# Patient Record
Sex: Male | Born: 1980 | Race: Black or African American | Hispanic: No | Marital: Single | Smoking: Current every day smoker
Health system: Southern US, Community
[De-identification: ages and names within clinical notes are randomized; demographics above are authoritative.]

## PROBLEM LIST (undated history)

## (undated) DIAGNOSIS — F840 Autistic disorder: Secondary | ICD-10-CM

---

## 2004-04-10 ENCOUNTER — Emergency Department (HOSPITAL_COMMUNITY): Admission: EM | Admit: 2004-04-10 | Discharge: 2004-04-10 | Payer: Self-pay | Admitting: Emergency Medicine

## 2013-05-01 ENCOUNTER — Emergency Department (HOSPITAL_COMMUNITY)
Admission: EM | Admit: 2013-05-01 | Discharge: 2013-05-01 | Disposition: A | Discharge status: Home / Self Care | Payer: Medicaid Other | Attending: Emergency Medicine | Admitting: Emergency Medicine

## 2013-05-01 ENCOUNTER — Encounter (HOSPITAL_COMMUNITY): Payer: Self-pay

## 2013-05-01 ENCOUNTER — Emergency Department (HOSPITAL_COMMUNITY): Payer: Medicaid Other

## 2013-05-01 DIAGNOSIS — F17200 Nicotine dependence, unspecified, uncomplicated: Secondary | ICD-10-CM | POA: Insufficient documentation

## 2013-05-01 DIAGNOSIS — R1033 Periumbilical pain: Secondary | ICD-10-CM | POA: Insufficient documentation

## 2013-05-01 DIAGNOSIS — R109 Unspecified abdominal pain: Secondary | ICD-10-CM

## 2013-05-01 DIAGNOSIS — K5900 Constipation, unspecified: Secondary | ICD-10-CM

## 2013-05-01 HISTORY — DX: Autistic disorder: F84.0

## 2013-05-01 LAB — CBC WITH DIFFERENTIAL/PLATELET
Basophils Relative: 1 % (ref 0–1)
Lymphocytes Relative: 16 % (ref 12–46)
MCH: 27.5 pg (ref 26.0–34.0)
Monocytes Absolute: 0.8 10*3/uL (ref 0.1–1.0)
Neutro Abs: 7.8 10*3/uL — ABNORMAL HIGH (ref 1.7–7.7)
RBC: 5.01 MIL/uL (ref 4.22–5.81)
RDW: 12.9 % (ref 11.5–15.5)
WBC: 10.4 10*3/uL (ref 4.0–10.5)

## 2013-05-01 LAB — COMPREHENSIVE METABOLIC PANEL
ALT: 30 U/L (ref 0–53)
AST: 30 U/L (ref 0–37)
BUN: 14 mg/dL (ref 6–23)
Calcium: 9.1 mg/dL (ref 8.4–10.5)
Creatinine, Ser: 0.85 mg/dL (ref 0.50–1.35)
Potassium: 4.3 mEq/L (ref 3.5–5.1)
Sodium: 141 mEq/L (ref 135–145)

## 2013-05-01 LAB — URINALYSIS, ROUTINE W REFLEX MICROSCOPIC
Bilirubin Urine: NEGATIVE
Glucose, UA: NEGATIVE mg/dL
Hgb urine dipstick: NEGATIVE
Leukocytes, UA: NEGATIVE
Specific Gravity, Urine: 1.025 (ref 1.005–1.030)
Urobilinogen, UA: 0.2 mg/dL (ref 0.0–1.0)
pH: 6 (ref 5.0–8.0)

## 2013-05-01 LAB — LIPASE, BLOOD: Lipase: 36 U/L (ref 11–59)

## 2013-05-01 MED ORDER — IOHEXOL 300 MG/ML  SOLN
50.0000 mL | Freq: Once | INTRAMUSCULAR | Status: AC | PRN
  Administered 2013-05-01: 50 mL via ORAL

## 2013-05-01 MED ORDER — DISPOSABLE ENEMA 19-7 GM/118ML RE ENEM: 1.0000 | ENEMA | Freq: Once | RECTAL | Status: AC

## 2013-05-01 MED ORDER — POLYETHYLENE GLYCOL 3350 17 G PO PACK: 17.0000 g | PACK | Freq: Every day | ORAL | Status: AC

## 2013-05-01 MED ORDER — IOHEXOL 300 MG/ML  SOLN
100.0000 mL | Freq: Once | INTRAMUSCULAR | Status: AC | PRN
  Administered 2013-05-01: 100 mL via INTRAVENOUS

## 2013-05-01 NOTE — ED Notes (Signed)
Caregiver reports that she noticed that the patient was laying around more than usual, decreased appetite.  The patient has a history of autism and has difficulty with conversation.  The patient states that he feels constipated and has not had a BM for several days.

## 2013-05-01 NOTE — ED Provider Notes (Signed)
History     CSN: [id number]  Arrival date & time 05/01/13  1529   First MD Initiated Contact with Patient 05/01/13 1605      Chief Complaint  Patient presents with  . Abdominal Pain    (Consider location/radiation/quality/duration/timing/severity/associated sxs/prior treatment) HPI Comments: Autistic male presenting with two-day history of lower abdominal pain is periumbilical. It does not move. Denies any fever, nausea or vomiting. Does have decreased appetite. Last bowel movement 2 days ago. Denies any fever, chills, chest pain or shortness of breath. No testicle pain, difficulty with urination.  The history is provided by the patient.    Past Medical History  Diagnosis Date  . Autism     History reviewed. No pertinent past surgical history.  No family history on file.  History  Substance Use Topics  . Smoking status: Current Every Day Smoker    Types: Cigarettes  . Smokeless tobacco: Not on file  . Alcohol Use: No      Review of Systems  Constitutional: Negative for fever, activity change and appetite change.  HENT: Negative for congestion and rhinorrhea.   Respiratory: Negative for cough, chest tightness and stridor.   Cardiovascular: Negative for chest pain.  Gastrointestinal: Positive for abdominal pain and constipation. Negative for nausea, vomiting and diarrhea.  Genitourinary: Negative for dysuria, urgency, hematuria and testicular pain.  Musculoskeletal: Negative for back pain.  Skin: Negative for rash.  Neurological: Negative for dizziness, weakness and headaches.  A complete 10 system review of systems was obtained and all systems are negative except as noted in the HPI and PMH.    Allergies  Review of patient's allergies indicates no known allergies.  Home Medications  No current outpatient prescriptions on file.  BP 90/63  Pulse 84  Temp(Src) 98.7 F (37.1 C) (Oral)  Resp 20  Ht 5\' 10"  (1.778 m)  Wt 170 lb (77.111 kg)  BMI 24.39 kg/m2   SpO2 100%  Physical Exam  Constitutional: He is oriented to person, place, and time. He appears well-developed and well-nourished. No distress.  HENT:  Head: Normocephalic and atraumatic.  Mouth/Throat: Oropharynx is clear and moist. No oropharyngeal exudate.  Eyes: Conjunctivae and EOM are normal. Pupils are equal, round, and reactive to light.  Neck: Normal range of motion. Neck supple.  Cardiovascular: Normal rate, regular rhythm and normal heart sounds.   No murmur heard. Pulmonary/Chest: Effort normal and breath sounds normal. No respiratory distress.  Abdominal: Soft. There is tenderness. There is no rebound and no guarding.  Mild periumbilical pain without guarding or rebound. No pain at McBurney's point. No Murphy's sign.  Musculoskeletal: Normal range of motion. He exhibits no edema and no tenderness.  Neurological: He is alert and oriented to person, place, and time. No cranial nerve deficit. He exhibits normal muscle tone. Coordination normal.  Skin: Skin is warm.    ED Course  Procedures (including critical care time)  Labs Reviewed  CBC WITH DIFFERENTIAL - Abnormal; Notable for the following:    Neutro Abs 7.8 (*)    All other components within normal limits  COMPREHENSIVE METABOLIC PANEL - Abnormal; Notable for the following:    Total Bilirubin 0.2 (*)    All other components within normal limits  LIPASE, BLOOD  URINALYSIS, ROUTINE W REFLEX MICROSCOPIC   Ct Abdomen Pelvis W Contrast  05/01/2013  *RADIOLOGY REPORT*  Clinical Data: Decreased appetite.  Constipation.  CT ABDOMEN AND PELVIS WITH CONTRAST  Technique:  Multidetector CT imaging of the abdomen and pelvis was  performed following the standard protocol during bolus administration of intravenous contrast.  Contrast: 50mL OMNIPAQUE IOHEXOL 300 MG/ML  SOLN, OMNIPAQUE IOHEXOL 300 MG/ML  SOLN  Comparison: None.  Findings: Linear atelectasis or scarring in the lung bases.  Heart is borderline in size.  No  effusions.  Gallbladder is contracted.  Liver, spleen, stomach, adrenals, pancreas, kidneys are unremarkable.  There is a left inguinal hernia containing fat and fluid.  Urinary bladder wall appears mildly thickened.  Cannot exclude cystitis.  Small bowel is decompressed.  Moderate to large stool burden throughout the colon, five particularly rectosigmoid colon and cecum.  No free fluid, free air or adenopathy.  Aorta is normal caliber.  No acute bony abnormality.  IMPRESSION: Mild diffuse bladder wall thickening suggesting the possibility of cystitis.  Recommend clinical correlation.  Left inguinal hernia containing fat and a small amount of fluid.  Moderate to large stool burden throughout the colon.   Original Report Authenticated By: Charlett Nose, M.D.      No diagnosis found.    MDM  2 days of abdominal pain without associated symptoms. No fevers.  Labs unremarkable. CT scan obtained given patient's difficulty to communicate.  Urinalysis negative. CT discussed with Dr. Kearney Hard. Large stool burden. Appendix not identified but no right lower quadrant inflammation.  We'll treat for constipation with MiraLAX. Follow up with PCP. Tolerating by mouth in ED. No vomiting.      Glynn Octave, MD 05/01/13 [phone]

## 2013-05-01 NOTE — ED Notes (Addendum)
Mother reports that pt began having "navel pain " this afternoon.  Denies any fever, no nausea or vomiting. Pt stated that it hurts when he urinates.  Also having painful bm's

## 2014-07-14 IMAGING — CT CT ABD-PELV W/ CM
2 of 3 series · 16 of 46 positions shown, 18 images · IV contrast (Omnipaque 300)
Comparison: None.

CLINICAL DATA: Decreased appetite.  Constipation.

CT ABDOMEN AND PELVIS WITH CONTRAST
TECHNIQUE: Multidetector CT imaging of the abdomen and pelvis was
performed following the standard protocol during bolus
administration of intravenous contrast.
Contrast: 50mL OMNIPAQUE IOHEXOL 300 MG/ML  SOLN, 100mL OMNIPAQUE
IOHEXOL 300 MG/ML  SOLN

[Series 2: abd_pel_with 5.0 b40f · axial · 0.62mm/px · z∈[-446,-66]mm · 13 of 88 slices shown, 15 images]
[im 6/88  soft-tissue]
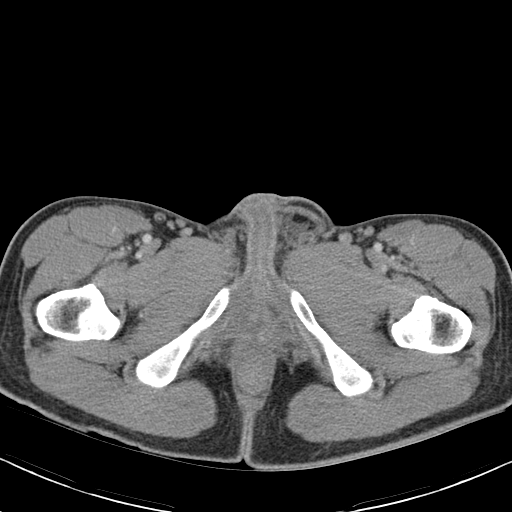
[im 6/88  bone]
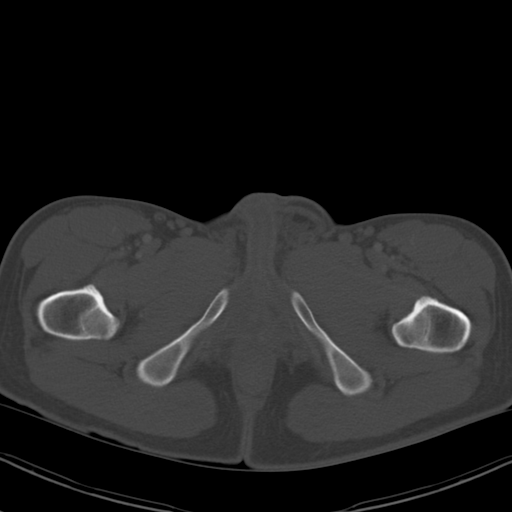
[im 12/88  soft-tissue]
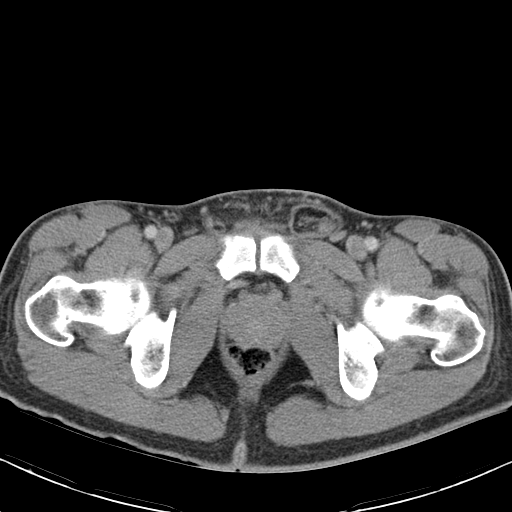
[im 17/88  soft-tissue]
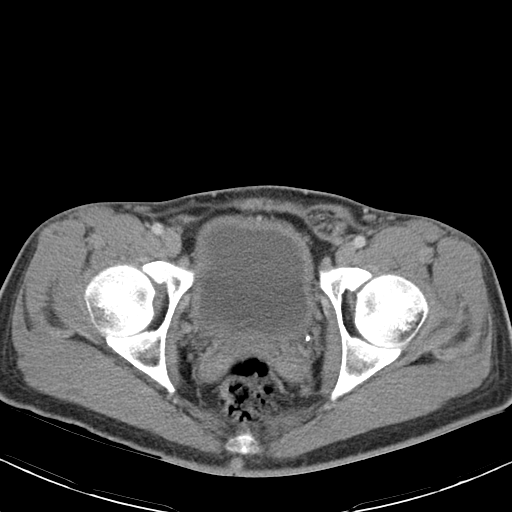
[im 26/88  soft-tissue]
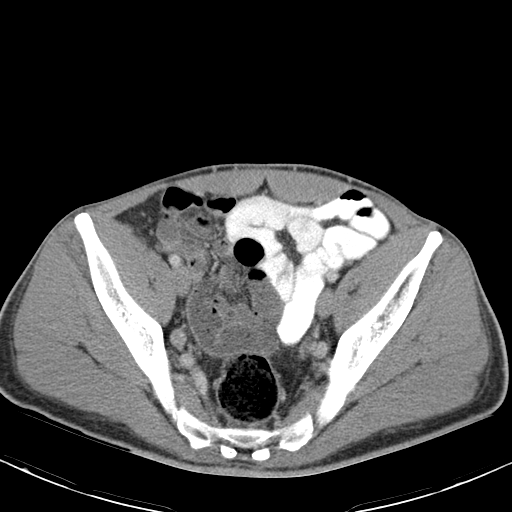
[im 31/88  soft-tissue]
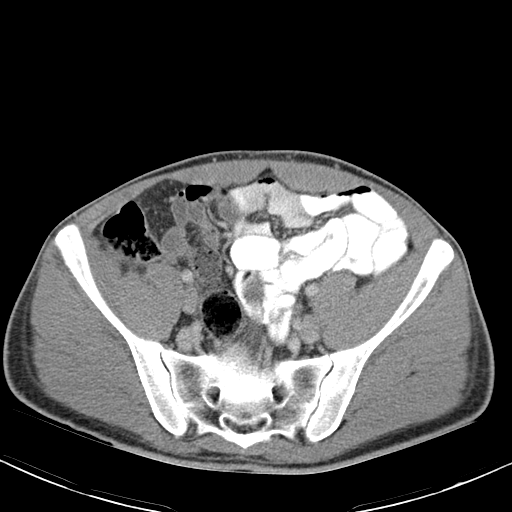
[im 37/88  soft-tissue]
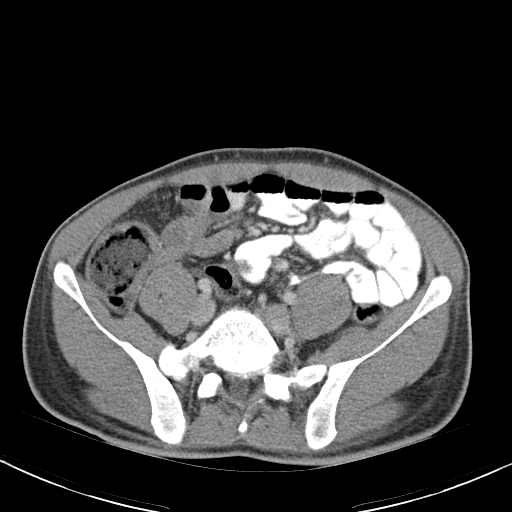
[im 45/88  soft-tissue]
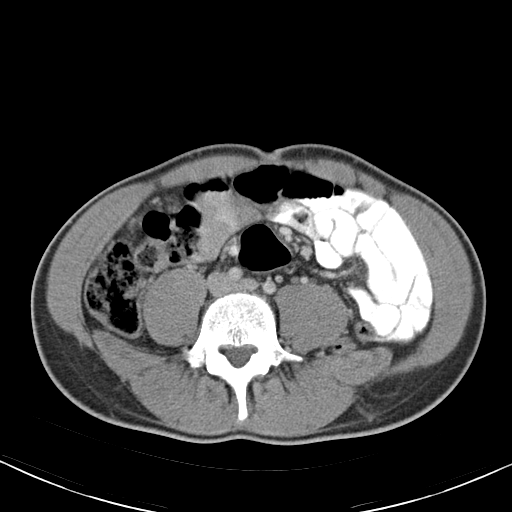
[im 51/88  soft-tissue]
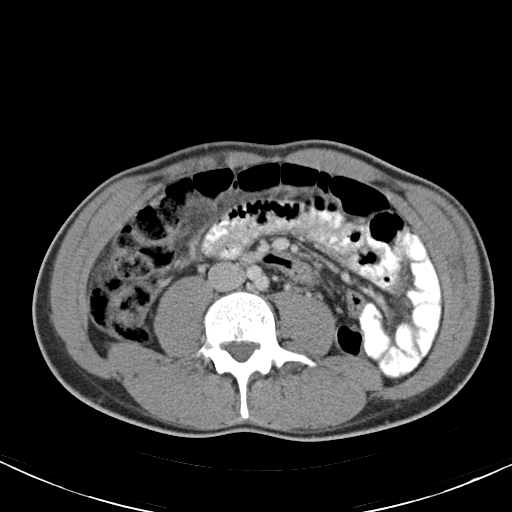
[im 57/88  soft-tissue]
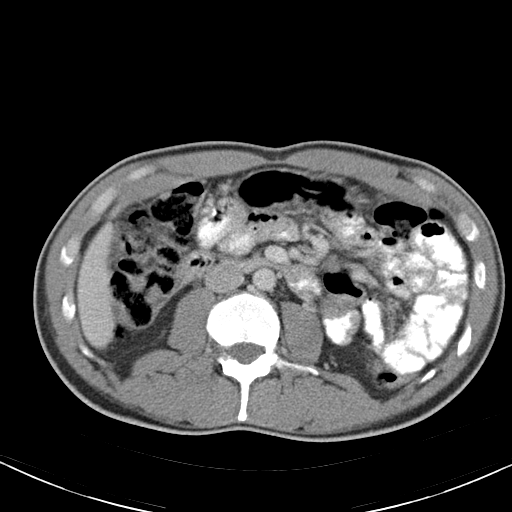
[im 57/88  bone]
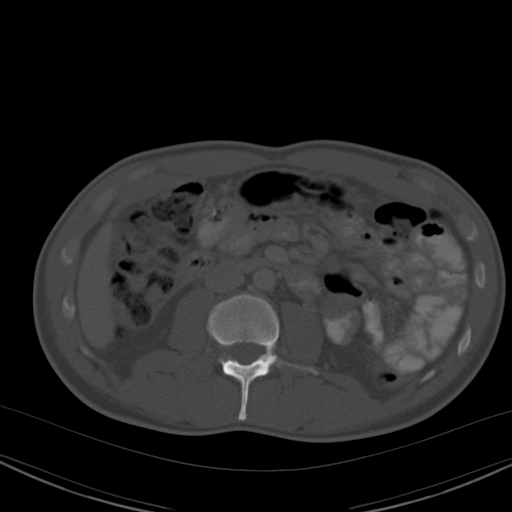
[im 62/88  soft-tissue]
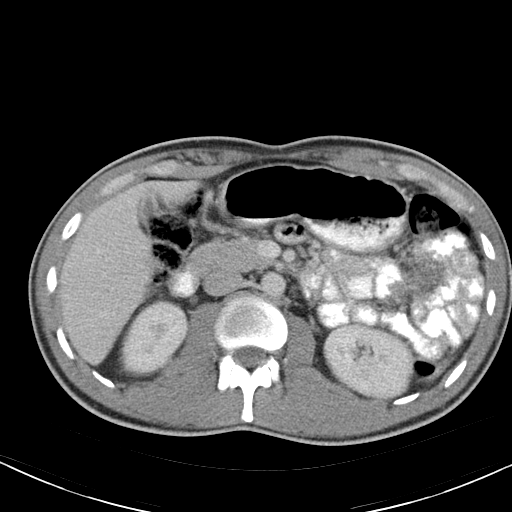
[im 71/88  soft-tissue]
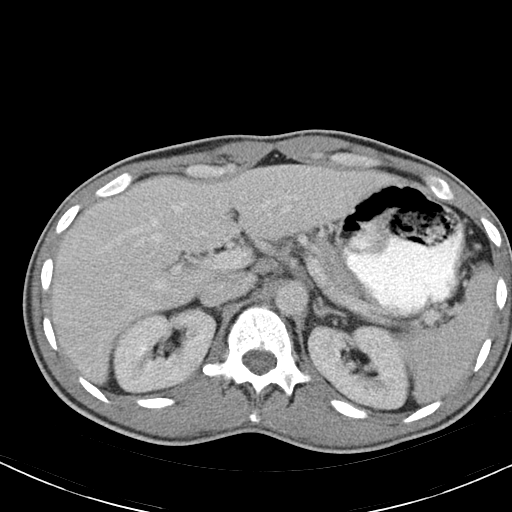
[im 76/88  soft-tissue]
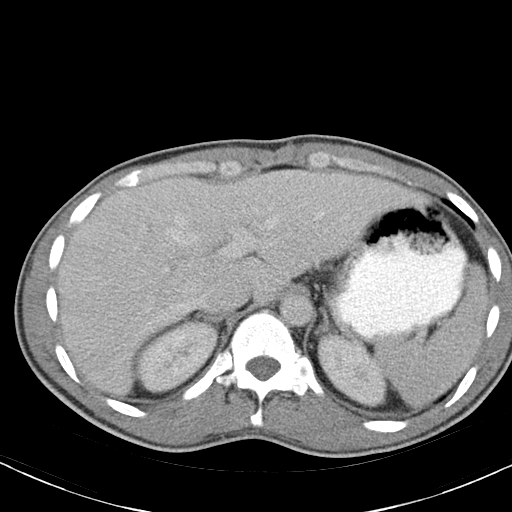
[im 82/88  soft-tissue]
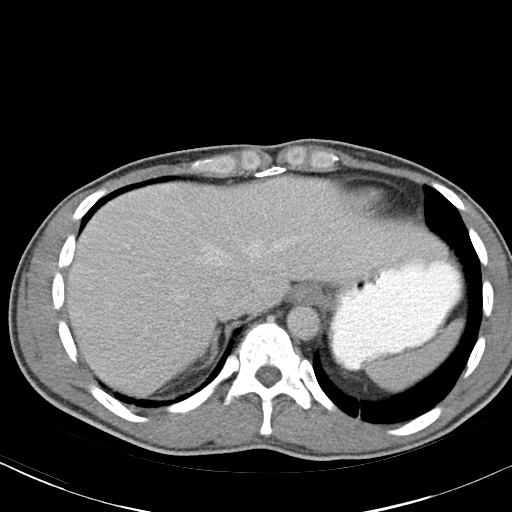

[Series 4: abd_pel_with 3.0 spo · coronal · 0.58mm/px · 3 of 71 slices shown]
[im 24/71  soft-tissue]
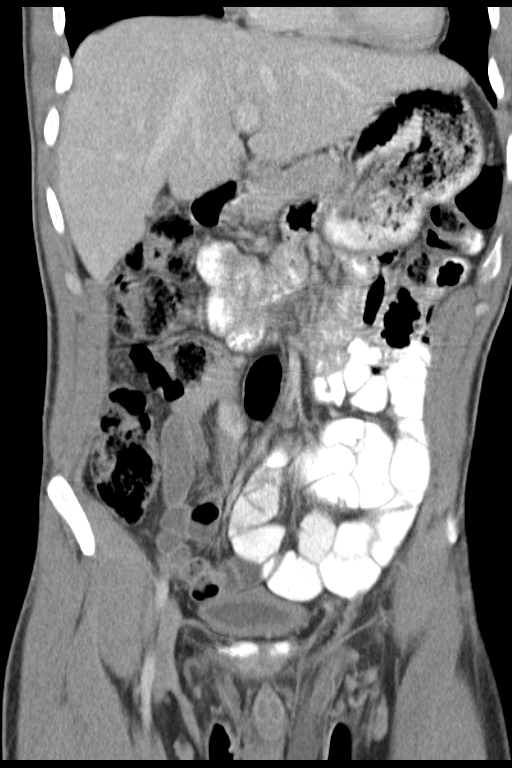
[im 32/71  soft-tissue]
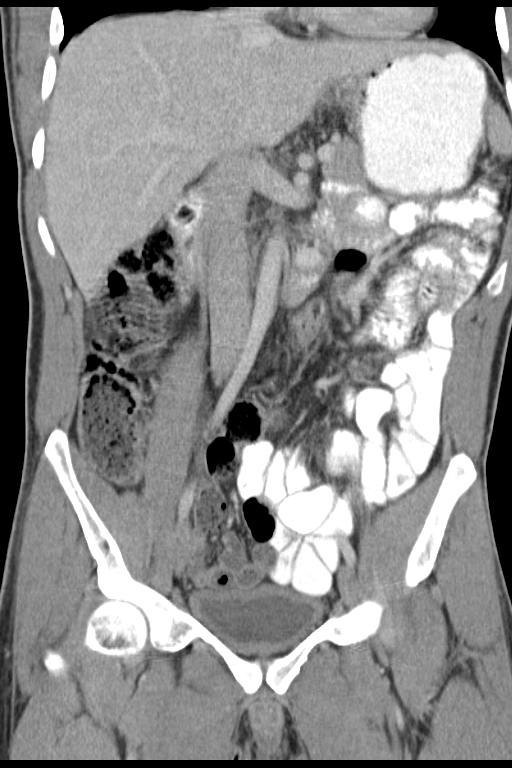
[im 39/71  soft-tissue]
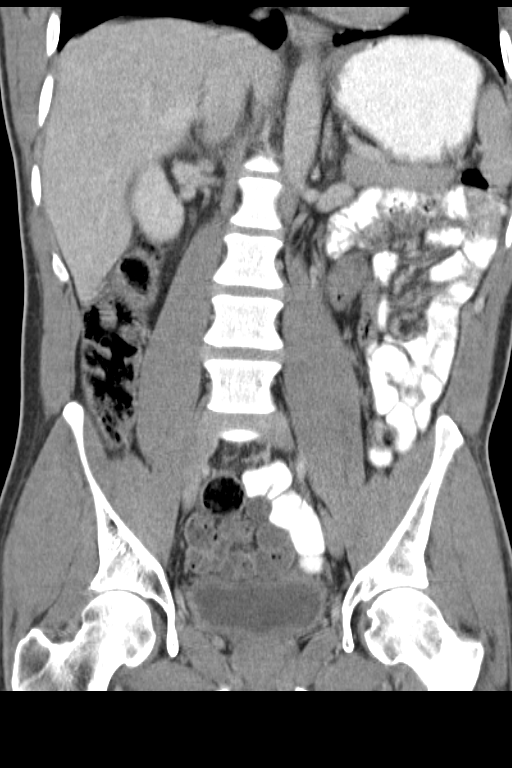

[16 of 46 positions shown; findings below may reference images not displayed]

FINDINGS: Linear atelectasis or scarring in the lung bases.  Heart
is borderline in size.  No effusions.

Gallbladder is contracted.  Liver, spleen, stomach, adrenals,
pancreas, kidneys are unremarkable.  There is a left inguinal
hernia containing fat and fluid.  Urinary bladder wall appears
mildly thickened.  Cannot exclude cystitis.

Small bowel is decompressed.  Moderate to large stool burden
throughout the colon, five particularly rectosigmoid colon and
cecum.  No free fluid, free air or adenopathy.  Aorta is normal
caliber.

No acute bony abnormality.
IMPRESSION: Mild diffuse bladder wall thickening suggesting the possibility of
cystitis.  Recommend clinical correlation.

Left inguinal hernia containing fat and a small amount of fluid.

Moderate to large stool burden throughout the colon.

## 2018-07-08 ENCOUNTER — Encounter: Payer: Self-pay | Admitting: General Surgery

## 2018-07-08 ENCOUNTER — Ambulatory Visit (INDEPENDENT_AMBULATORY_CARE_PROVIDER_SITE_OTHER): Payer: Medicaid Other | Admitting: General Surgery

## 2018-07-08 VITALS — BP 114/76 | HR 79 | Temp 98.4°F | Resp 16 | Wt 144.0 lb

## 2018-07-08 DIAGNOSIS — K4090 Unilateral inguinal hernia, without obstruction or gangrene, not specified as recurrent: Secondary | ICD-10-CM | POA: Diagnosis not present

## 2018-07-08 NOTE — Patient Instructions (Signed)

## 2018-07-08 NOTE — Progress Notes (Signed)
Isaac Rhodes; [id number]; 10/19/1981   HPI Patient is a 37 year old black male who was referred to my care by Dr. Lynford CitizenMacIairmid for evaluation and treatment of a left inguinal hernia.  This was found on routine examination.  Patient is autistic, thus history was obtained from the patient's mother.  She states that it does stick out on occasion when he is straining, but resolves when lying down is not causing him any pain.  He has never had any nausea or vomiting.  He currently has 0 out of 10 abdominal pain. Past Medical History:  Diagnosis Date  . Autism     History reviewed. No pertinent surgical history.  History reviewed. No pertinent family history.  Current Outpatient Medications on File Prior to Visit  Medication Sig Dispense Refill  . polyethylene glycol (MIRALAX) packet Take 17 g by mouth daily. (Patient not taking: Reported on 07/08/2018) 14 each 0  . sodium phosphate (FLEET) enema Place 1 enema rectally once. follow package directions (Patient not taking: Reported on 07/08/2018) 135 mL 0   No current facility-administered medications on file prior to visit.     No Known Allergies  Social History   Substance and Sexual Activity  Alcohol Use No    Social History   Tobacco Use  Smoking Status Current Every Day Smoker  . Types: Cigarettes  Smokeless Tobacco Never Used    Review of Systems  Constitutional: Negative.   HENT: Negative.   Eyes: Negative.   Respiratory: Negative.   Cardiovascular: Negative.   Gastrointestinal: Negative.   Genitourinary: Negative.   Musculoskeletal: Negative.   Skin: Negative.   Neurological: Negative.   Endo/Heme/Allergies: Negative.   Psychiatric/Behavioral:       Autistic    Objective   Vitals:   07/08/18 1047  BP: 114/76  Pulse: 79  Resp: 16  Temp: 98.4 F (36.9 C)    Physical Exam  Constitutional: He is oriented to person, place, and time. He appears well-developed and well-nourished. No distress.  HENT:  Head:  Normocephalic and atraumatic.  Cardiovascular: Normal rate, regular rhythm and normal heart sounds. Exam reveals no gallop and no friction rub.  No murmur heard. Pulmonary/Chest: Effort normal and breath sounds normal. No stridor. No respiratory distress. He has no wheezes. He has no rales.  Abdominal: Soft. Bowel sounds are normal. He exhibits no distension and no mass. There is no tenderness. There is no guarding. A hernia is present.  Large easily reducible left inguinal hernia.  Genitourinary:  Genitourinary Comments: Genitourinary examination within normal limits.  Neurological: He is alert and oriented to person, place, and time.  Skin: Skin is warm and dry.  Vitals reviewed. Dr. Mina MarbleMacDiarmid's notes reviewed.  Assessment  Left inguinal hernia, currently asymptomatic Plan   As the patient is currently asymptomatic, no need for surgical intervention at this time.  Signs and symptoms of incarceration were explained to the patient's mother.  I suspect he will need repair of the left inguinal hernia in the future.  She was instructed to call me and return should he develop symptoms.  Follow-up as needed.
